# Patient Record
Sex: Female | Born: 1995 | Race: White | Marital: Married | Smoking: Never smoker
Health system: Southern US, Community
[De-identification: ages and names within clinical notes are randomized; demographics above are authoritative.]

---

## 2021-02-01 ENCOUNTER — Other Ambulatory Visit: Payer: Self-pay

## 2021-02-01 ENCOUNTER — Other Ambulatory Visit
Admission: RE | Admit: 2021-02-01 | Discharge: 2021-02-01 | Disposition: A | Discharge status: Home / Self Care | Payer: Self-pay | Attending: Nephrology | Admitting: Nephrology

## 2021-02-01 LAB — HEPATITIS B CORE ANTIBODY, TOTAL: Hep B Core Total Ab: NONREACTIVE

## 2021-02-01 LAB — HEPATITIS B SURFACE ANTIGEN: Hepatitis B Surface Ag: NONREACTIVE

## 2021-02-01 LAB — HEPATITIS C ANTIBODY: HCV Ab: NONREACTIVE

## 2021-02-02 LAB — HEPATITIS B SURFACE ANTIBODY, QUANTITATIVE: Hep B S AB Quant (Post): <3.1 m[IU]/mL — ABNORMAL LOW (ref >9.9)

## 2021-02-28 ENCOUNTER — Emergency Department
Admission: EM | Admit: 2021-02-28 | Discharge: 2021-02-28 | Disposition: A | Discharge status: Home / Self Care | Payer: BC Managed Care – PPO | Attending: Emergency Medicine | Admitting: Emergency Medicine

## 2021-02-28 ENCOUNTER — Emergency Department: Payer: BC Managed Care – PPO

## 2021-02-28 ENCOUNTER — Encounter: Payer: Self-pay | Admitting: *Deleted

## 2021-02-28 ENCOUNTER — Other Ambulatory Visit: Payer: Self-pay

## 2021-02-28 DIAGNOSIS — R5383 Other fatigue: Secondary | ICD-10-CM | POA: Diagnosis not present

## 2021-02-28 DIAGNOSIS — R000 Tachycardia, unspecified: Secondary | ICD-10-CM | POA: Diagnosis not present

## 2021-02-28 DIAGNOSIS — R519 Headache, unspecified: Secondary | ICD-10-CM | POA: Insufficient documentation

## 2021-02-28 DIAGNOSIS — R6883 Chills (without fever): Secondary | ICD-10-CM | POA: Diagnosis not present

## 2021-02-28 DIAGNOSIS — R42 Dizziness and giddiness: Secondary | ICD-10-CM | POA: Diagnosis present

## 2021-02-28 DIAGNOSIS — H538 Other visual disturbances: Secondary | ICD-10-CM | POA: Insufficient documentation

## 2021-02-28 DIAGNOSIS — R110 Nausea: Secondary | ICD-10-CM | POA: Insufficient documentation

## 2021-02-28 DIAGNOSIS — Z20822 Contact with and (suspected) exposure to covid-19: Secondary | ICD-10-CM | POA: Diagnosis not present

## 2021-02-28 LAB — BASIC METABOLIC PANEL
Anion gap: 10 (ref 5–15)
BUN: 10 mg/dL (ref 6–20)
CO2: 25 mmol/L (ref 22–32)
Calcium: 9.8 mg/dL (ref 8.9–10.3)
Chloride: 101 mmol/L (ref 98–111)
Creatinine, Ser: 0.64 mg/dL (ref 0.44–1.00)
GFR, Estimated: >60 mL/min (ref >60)
Glucose, Bld: 102 mg/dL — ABNORMAL HIGH (ref 70–99)
Potassium: 4 mmol/L (ref 3.5–5.1)
Sodium: 136 mmol/L (ref 135–145)

## 2021-02-28 LAB — URINALYSIS, COMPLETE (UACMP) WITH MICROSCOPIC
Bilirubin Urine: NEGATIVE
Glucose, UA: NEGATIVE mg/dL
Hgb urine dipstick: NEGATIVE
Ketones, ur: 5 mg/dL — AB
Leukocytes,Ua: NEGATIVE
Nitrite: NEGATIVE
Protein, ur: NEGATIVE mg/dL
Specific Gravity, Urine: 1.002 — ABNORMAL LOW (ref 1.005–1.030)
pH: 7 (ref 5.0–8.0)

## 2021-02-28 LAB — RESP PANEL BY RT-PCR (FLU A&B, COVID) ARPGX2
Influenza A by PCR: NEGATIVE
Influenza B by PCR: NEGATIVE
SARS Coronavirus 2 by RT PCR: NEGATIVE

## 2021-02-28 LAB — T4, FREE: Free T4: 0.88 ng/dL (ref 0.61–1.12)

## 2021-02-28 LAB — D-DIMER, QUANTITATIVE: D-Dimer, Quant: <0.27 ug/mL-FEU (ref 0.00–0.50)

## 2021-02-28 LAB — TROPONIN I (HIGH SENSITIVITY)
Troponin I (High Sensitivity): 3 ng/L (ref <18)
Troponin I (High Sensitivity): 3 ng/L (ref <18)

## 2021-02-28 LAB — TSH: TSH: 3.806 u[IU]/mL (ref 0.350–4.500)

## 2021-02-28 LAB — CBC
HCT: 38.7 % (ref 36.0–46.0)
Hemoglobin: 12.9 g/dL (ref 12.0–15.0)
MCH: 28.1 pg (ref 26.0–34.0)
MCHC: 33.3 g/dL (ref 30.0–36.0)
MCV: 84.3 fL (ref 80.0–100.0)
Platelets: 484 10*3/uL — ABNORMAL HIGH (ref 150–400)
RBC: 4.59 MIL/uL (ref 3.87–5.11)
RDW: 12.8 % (ref 11.5–15.5)
WBC: 18.6 10*3/uL — ABNORMAL HIGH (ref 4.0–10.5)
nRBC: 0 % (ref 0.0–0.2)

## 2021-02-28 LAB — POC URINE PREG, ED: Preg Test, Ur: NEGATIVE

## 2021-02-28 MED ORDER — SODIUM CHLORIDE 0.9 % IV BOLUS
1000.0000 mL | Freq: Once | INTRAVENOUS | Status: AC
  Administered 2021-02-28: 1000 mL via INTRAVENOUS

## 2021-02-28 MED ORDER — ONDANSETRON HCL 4 MG/2ML IJ SOLN
4.0000 mg | Freq: Once | INTRAMUSCULAR | Status: AC
  Administered 2021-02-28: 4 mg via INTRAVENOUS
  Filled 2021-02-28: qty 2

## 2021-02-28 NOTE — Discharge Instructions (Signed)
Your lab tests and CT scan were all normal today.  Please continue to drink lots of fluids to stay well-hydrated, allow yourself extra rest, and follow-up with primary care.

## 2021-02-28 NOTE — ED Provider Notes (Signed)
[facility] Emergency Department Provider Note  ____________________________________________  Time seen: Approximately 11:05 PM  I have reviewed the triage vital signs and the nursing notes.   HISTORY  Chief Complaint Dizziness    HPI Eileen Parker is a 25 y.o. female with no significant past medical history who comes ED complaining of  dizziness with nausea, occipital headache, and blurry vision that started at work today about 11:00 AM.  Constant, gradually improved after onset.  Currently feels much better.  She is steady on her feet.  Also has chills and fatigue.  Has been eating and drinking normally.  No chest pain shortness of breath belly pain vomiting.  No known sick contacts.     Past medical history significant for anxiety.   There are no problems to display for this patient.       Prior to Admission medications   Not on File  None   Allergies Patient has no known allergies.   No family history on file.  Social History Social History   Tobacco Use  . Smoking status: Never Smoker  . Smokeless tobacco: Never Used  Substance Use Topics  . Alcohol use: Not Currently  . Drug use: Not Currently    Review of Systems  Constitutional:   No fever positive chills.  ENT:   No sore throat. No rhinorrhea. Cardiovascular:   No chest pain or syncope. Respiratory:   No dyspnea or cough. Gastrointestinal:   Negative for abdominal pain, vomiting and diarrhea.  Musculoskeletal:   Negative for focal pain or swelling All other systems reviewed and are negative except as documented above in ROS and HPI.  ____________________________________________   PHYSICAL EXAM:  VITAL SIGNS: ED Triage Vitals [02/28/21 1912]  Enc Vitals Group     BP (!) 147/94     Pulse Rate (!) 125     Resp 20     Temp 99.2 F (37.3 C)     Temp Source Oral     SpO2 99 %     Weight 213 lb (96.6 kg)     Height 5\' 6"  (1.676 m)     Head Circumference       Peak Flow      Pain Score 5     Pain Loc      Pain Edu?      Excl. in GC?     Vital signs reviewed, nursing assessments reviewed.   Constitutional:   Alert and oriented. Non-toxic appearance. Eyes:   Conjunctivae are normal. EOMI. PERRL.  No nystagmus ENT      Head:   Normocephalic and atraumatic.      Nose:   Wearing a mask.      Mouth/Throat:   Wearing a mask.      Neck:   No meningismus. Full ROM. Hematological/Lymphatic/Immunilogical:   No cervical lymphadenopathy. Cardiovascular:   Tachycardia heart rate 115. Symmetric bilateral radial and DP pulses.  No murmurs. Cap refill less than 2 seconds. Respiratory:   Normal respiratory effort without tachypnea/retractions. Breath sounds are clear and equal bilaterally. No wheezes/rales/rhonchi. Gastrointestinal:   Soft and nontender. Non distended. There is no CVA tenderness.  No rebound, rigidity, or guarding. Genitourinary:   deferred Musculoskeletal:   Normal range of motion in all extremities. No joint effusions.  No lower extremity tenderness.  No edema. Neurologic:   Normal speech and language.  Motor grossly intact. No acute focal neurologic deficits are appreciated.  Skin:    Skin is warm, dry and  intact. No rash noted.  No petechiae, purpura, or bullae.  ____________________________________________    LABS (pertinent positives/negatives) (all labs ordered are listed, but only abnormal results are displayed) Labs Reviewed  BASIC METABOLIC PANEL - Abnormal; Notable for the following components:      Result Value   Glucose, Bld 102 (*)    All other components within normal limits  CBC - Abnormal; Notable for the following components:   WBC 18.6 (*)    Platelets 484 (*)    All other components within normal limits  URINALYSIS, COMPLETE (UACMP) WITH MICROSCOPIC - Abnormal; Notable for the following components:   Color, Urine COLORLESS (*)    APPearance CLEAR (*)    Specific Gravity, Urine 1.002 (*)    Ketones, ur 5 (*)     Bacteria, UA FEW (*)    All other components within normal limits  POC URINE PREG, ED - Normal  RESP PANEL BY RT-PCR (FLU A&B, COVID) ARPGX2  D-DIMER, QUANTITATIVE  TSH  T4, FREE  TROPONIN I (HIGH SENSITIVITY)  TROPONIN I (HIGH SENSITIVITY)   ____________________________________________   EKG  Interpreted by me Sinus tachycardia rate 112.  Normal axis and intervals.  Normal QRS and ST segments.  There is a T wave inversion in lead III, S1Q3T3 pattern  ____________________________________________    RADIOLOGY  DG Chest 2 View  Result Date: 02/28/2021 CLINICAL DATA:  Dizziness EXAM: CHEST - 2 VIEW COMPARISON:  None. FINDINGS: The heart size and mediastinal contours are within normal limits. Both lungs are clear. The visualized skeletal structures are unremarkable. IMPRESSION: No active cardiopulmonary disease. Electronically Signed   By: Jasmine Pang M.D.   On: 02/28/2021 19:43   CT Head Wo Contrast  Result Date: 02/28/2021 CLINICAL DATA:  Dizziness. EXAM: CT HEAD WITHOUT CONTRAST TECHNIQUE: Contiguous axial images were obtained from the base of the skull through the vertex without intravenous contrast. COMPARISON:  None. FINDINGS: Brain: No evidence of acute infarction, hemorrhage, hydrocephalus, extra-axial collection or mass lesion/mass effect. Vascular: No hyperdense vessel or unexpected calcification. Skull: Normal. Negative for fracture or focal lesion. Sinuses/Orbits: No acute finding. Other: None. IMPRESSION: No acute intracranial abnormality. Electronically Signed   By: Aram Candela M.D.   On: 02/28/2021 20:59    ____________________________________________   PROCEDURES Procedures  ____________________________________________  DIFFERENTIAL DIAGNOSIS   Dehydration, anxiety, pulmonary embolism, hyperthyroidism, viral illness  CLINICAL IMPRESSION / ASSESSMENT AND PLAN / ED COURSE  Medications ordered in the ED: Medications  sodium chloride 0.9 % bolus 1,000  mL (0 mLs Intravenous Stopped 02/28/21 2205)  ondansetron (ZOFRAN) injection 4 mg (4 mg Intravenous Given 02/28/21 2039)    Pertinent labs & imaging results that were available during my care of the patient were reviewed by me and considered in my medical decision making (see chart for details).  Saranya Harlin was evaluated in Emergency Department on 02/28/2021 for the symptoms described in the history of present illness. She was evaluated in the context of the global COVID-19 pandemic, which necessitated consideration that the patient might be at risk for infection with the SARS-CoV-2 virus that causes COVID-19. Institutional protocols and algorithms that pertain to the evaluation of patients at risk for COVID-19 are in a state of rapid change based on information released by regulatory bodies including the CDC and federal and state organizations. These policies and algorithms were followed during the patient's care in the ED.   Patient presents with vague constitutional symptoms, episode of blurry vision and dizziness earlier today.  CT head  negative for intracranial hemorrhage or tumor.  Labs all unremarkable except for mild leukocytosis which is nonspecific.  No evidence of infection.  Heart rate normalized after IV fluids.  Stable for discharge home.  Clinical Course as of 02/28/21 2310  Mon Feb 28, 2021  2309 Chest x-ray viewed and interpreted by me, unremarkable.  COVID-negative.  D-dimer normal [PS]    Clinical Course User Index [PS] Sharman Cheek, MD     ____________________________________________   FINAL CLINICAL IMPRESSION(S) / ED DIAGNOSES    Final diagnoses:  Dizziness     ED Discharge Orders    None      Portions of this note were generated with dragon dictation software. Dictation errors may occur despite best attempts at proofreading.   Sharman Cheek, MD 02/28/21 [phone]

## 2021-02-28 NOTE — ED Triage Notes (Signed)
Pt had dizziness episode at work today.  Pt reports nausea.  Pt also has a headache.  No chest pain.  Hx anxiety.  Pt alert  Speech clear.

## 2021-05-18 ENCOUNTER — Encounter: Payer: Self-pay | Admitting: Emergency Medicine

## 2021-05-18 ENCOUNTER — Emergency Department: Payer: BC Managed Care – PPO

## 2021-05-18 ENCOUNTER — Other Ambulatory Visit: Payer: Self-pay

## 2021-05-18 ENCOUNTER — Emergency Department
Admission: EM | Admit: 2021-05-18 | Discharge: 2021-05-18 | Disposition: A | Discharge status: Home / Self Care | Payer: BC Managed Care – PPO | Attending: Emergency Medicine | Admitting: Emergency Medicine

## 2021-05-18 DIAGNOSIS — U071 COVID-19: Secondary | ICD-10-CM | POA: Diagnosis not present

## 2021-05-18 DIAGNOSIS — R509 Fever, unspecified: Secondary | ICD-10-CM

## 2021-05-18 DIAGNOSIS — B349 Viral infection, unspecified: Secondary | ICD-10-CM | POA: Insufficient documentation

## 2021-05-18 DIAGNOSIS — R110 Nausea: Secondary | ICD-10-CM | POA: Insufficient documentation

## 2021-05-18 DIAGNOSIS — Z20822 Contact with and (suspected) exposure to covid-19: Secondary | ICD-10-CM

## 2021-05-18 LAB — SARS CORONAVIRUS 2 (TAT 6-24 HRS): SARS Coronavirus 2: POSITIVE — AB

## 2021-05-18 LAB — GROUP A STREP BY PCR: Group A Strep by PCR: NOT DETECTED

## 2021-05-18 MED ORDER — ACETAMINOPHEN 500 MG PO TABS
1000.0000 mg | ORAL_TABLET | Freq: Once | ORAL | Status: AC
  Administered 2021-05-18: 1000 mg via ORAL
  Filled 2021-05-18: qty 2

## 2021-05-18 MED ORDER — ONDANSETRON HCL 4 MG PO TABS: 4.0000 mg | ORAL_TABLET | Freq: Three times a day (TID) | ORAL | 0 refills | Status: AC | PRN

## 2021-05-18 NOTE — ED Provider Notes (Signed)
[facility] Emergency Department Provider Note  ____________________________________________   Event Date/Time   First MD Initiated Contact with Patient 05/18/21 [phone]     (approximate)  I have reviewed the triage vital signs and the nursing notes.   HISTORY  Chief Complaint Fever   HPI Eileen Parker is a 25 y.o. female without significant past medical history who presents for assessment of 1 day of generalized body aches associate with sore throat fever and little nausea.  She denies any specific headache, chest pain, shortness of breath, cough, abdominal pain, diarrhea, urinary symptoms, rash or focal pain in the extremities.  No recent falls or injuries.  She denies tobacco abuse or illicit drug use.  No other acute concerns at this time.         History reviewed. No pertinent past medical history.  There are no problems to display for this patient.   History reviewed. No pertinent surgical history.  Prior to Admission medications   Medication Sig Start Date End Date Taking? Authorizing Provider  ondansetron (ZOFRAN) 4 MG tablet Take 1 tablet (4 mg total) by mouth every 8 (eight) hours as needed for up to 10 doses for nausea or vomiting. 05/18/21  Yes Gilles Chiquito, MD    Allergies Patient has no known allergies.  No family history on file.  Social History Social History   Tobacco Use   Smoking status: Never   Smokeless tobacco: Never  Vaping Use   Vaping Use: Never used  Substance Use Topics   Alcohol use: Not Currently   Drug use: Not Currently    Review of Systems  Review of Systems  Constitutional:  Positive for chills and fever.  HENT:  Positive for sore throat.   Eyes:  Negative for pain.  Respiratory:  Negative for cough and stridor.   Cardiovascular:  Negative for chest pain.  Gastrointestinal:  Negative for vomiting.  Genitourinary:  Negative for dysuria.  Musculoskeletal:  Positive for myalgias.  Skin:  Negative  for rash.  Neurological:  Negative for seizures, loss of consciousness and headaches.  Psychiatric/Behavioral:  Negative for suicidal ideas.   All other systems reviewed and are negative.    ____________________________________________   PHYSICAL EXAM:  VITAL SIGNS: ED Triage Vitals  Enc Vitals Group     BP 05/18/21 0144 137/79     Pulse Rate 05/18/21 0144 (!) 120     Resp 05/18/21 0144 18     Temp 05/18/21 0144 (!) 103.2 F (39.6 C)     Temp Source 05/18/21 0144 Oral     SpO2 05/18/21 0144 98 %     Weight 05/18/21 0139 220 lb (99.8 kg)     Height 05/18/21 0139 5\' 6"  (1.676 m)     Head Circumference --      Peak Flow --      Pain Score 05/18/21 0139 7     Pain Loc --      Pain Edu? --      Excl. in GC? --    Vitals:   05/18/21 0144 05/18/21 0308  BP: 137/79 113/70  Pulse: (!) 120 98  Resp: 18 18  Temp: (!) 103.2 F (39.6 C) 99.5 F (37.5 C)  SpO2: 98% 100%   Physical Exam Vitals and nursing note reviewed.  Constitutional:      General: She is not in acute distress.    Appearance: She is well-developed.  HENT:     Head: Normocephalic and atraumatic.  Right Ear: External ear normal.     Left Ear: External ear normal.     Nose: Nose normal.     Mouth/Throat:     Mouth: Mucous membranes are moist.     Pharynx: Posterior oropharyngeal erythema present. No oropharyngeal exudate.  Eyes:     Conjunctiva/sclera: Conjunctivae normal.  Cardiovascular:     Rate and Rhythm: Normal rate and regular rhythm.     Pulses: Normal pulses.     Heart sounds: No murmur heard. Pulmonary:     Effort: Pulmonary effort is normal. No respiratory distress.     Breath sounds: Normal breath sounds.  Abdominal:     Palpations: Abdomen is soft.     Tenderness: There is no abdominal tenderness.  Musculoskeletal:     Cervical back: Neck supple. No rigidity.  Skin:    General: Skin is warm and dry.     Capillary Refill: Capillary refill takes less than 2 seconds.  Neurological:      Mental Status: She is alert and oriented to person, place, and time.  Psychiatric:        Mood and Affect: Mood normal.     ____________________________________________   LABS (all labs ordered are listed, but only abnormal results are displayed)  Labs Reviewed  GROUP A STREP BY PCR  SARS CORONAVIRUS 2 (TAT 6-24 HRS)  URINALYSIS, ROUTINE W REFLEX MICROSCOPIC  POC URINE PREG, ED   ____________________________________________  EKG   ____________________________________________  RADIOLOGY  ED MD interpretation: Chest x-ray has no effusion, edema, pneumothorax or any other clear acute intrathoracic process.  Official radiology report(s): DG Chest 2 View  Result Date: 05/18/2021 CLINICAL DATA:  Fever, sore throat and body aches. EXAM: CHEST - 2 VIEW COMPARISON:  Feb 28, 2021 FINDINGS: The heart size and mediastinal contours are within normal limits. Both lungs are clear. The visualized skeletal structures are unremarkable. IMPRESSION: No active cardiopulmonary disease. Electronically Signed   By: Aram Candela M.D.   On: 05/18/2021 04:22    ____________________________________________   PROCEDURES  Procedure(s) performed (including Critical Care):  Procedures   ____________________________________________   INITIAL IMPRESSION / ASSESSMENT AND PLAN / ED COURSE        Patient presents with a history of 1 day of generalized body aches associate with sore throat and some nausea.  On arrival she is febrile at 103.2 and tachycardic to 120 with otherwise stable vital signs on room air.  She did receive some Tylenol in triage and on my assessment with a temperature of 99.5 and heart rate of 98.  On exam she has some oropharyngeal erythema but no exudates or tonsillar enlargement uvular deviation or other evidence of deep space infection of the head or neck.  Lungs are clear bilaterally abdomen is soft nontender throughout.  She states she feels much better after Tylenol.   I suspect likely acute viral infection with COVID influenza high on the differential.  Chest x-ray and exam is not consistent with bacterial pneumonia.  Low suspicion at this time for bacterial sepsis, appendicitis, cystitis or other immediate life-threatening process.  Patient is not meningitic.  Given she states she is feeling better and had effervescence of her fever and resolution of her tachycardia with Tylenol with otherwise reassuring exam I think she is stable for discharge with close outpatient follow-up.  Advised her to isolate pending her COVID results return immediately if she experiences worsening of her symptoms.  Rx written for Zofran.      ____________________________________________  FINAL CLINICAL IMPRESSION(S) / ED DIAGNOSES  Final diagnoses:  Viral illness  Person under investigation for COVID-19    Medications  acetaminophen (TYLENOL) tablet 1,000 mg (1,000 mg Oral Given 05/18/21 0150)     ED Discharge Orders          Ordered    ondansetron (ZOFRAN) 4 MG tablet  Every 8 hours PRN        05/18/21 0444             Note:  This document was prepared using Dragon voice recognition software and may include unintentional dictation errors.    Gilles Chiquito, MD 05/18/21 [phone]

## 2021-05-18 NOTE — ED Triage Notes (Signed)
Patient ambulatory to triage with steady gait, without difficulty or distress noted; pt reports fever, sore throat and body aches since this am

## 2022-03-08 IMAGING — CR DG CHEST 2V
2 series · 2 of 2 positions shown · non-contrast
Comparison: None.

CLINICAL DATA: Dizziness

EXAM:
CHEST - 2 VIEW

[chest pa]
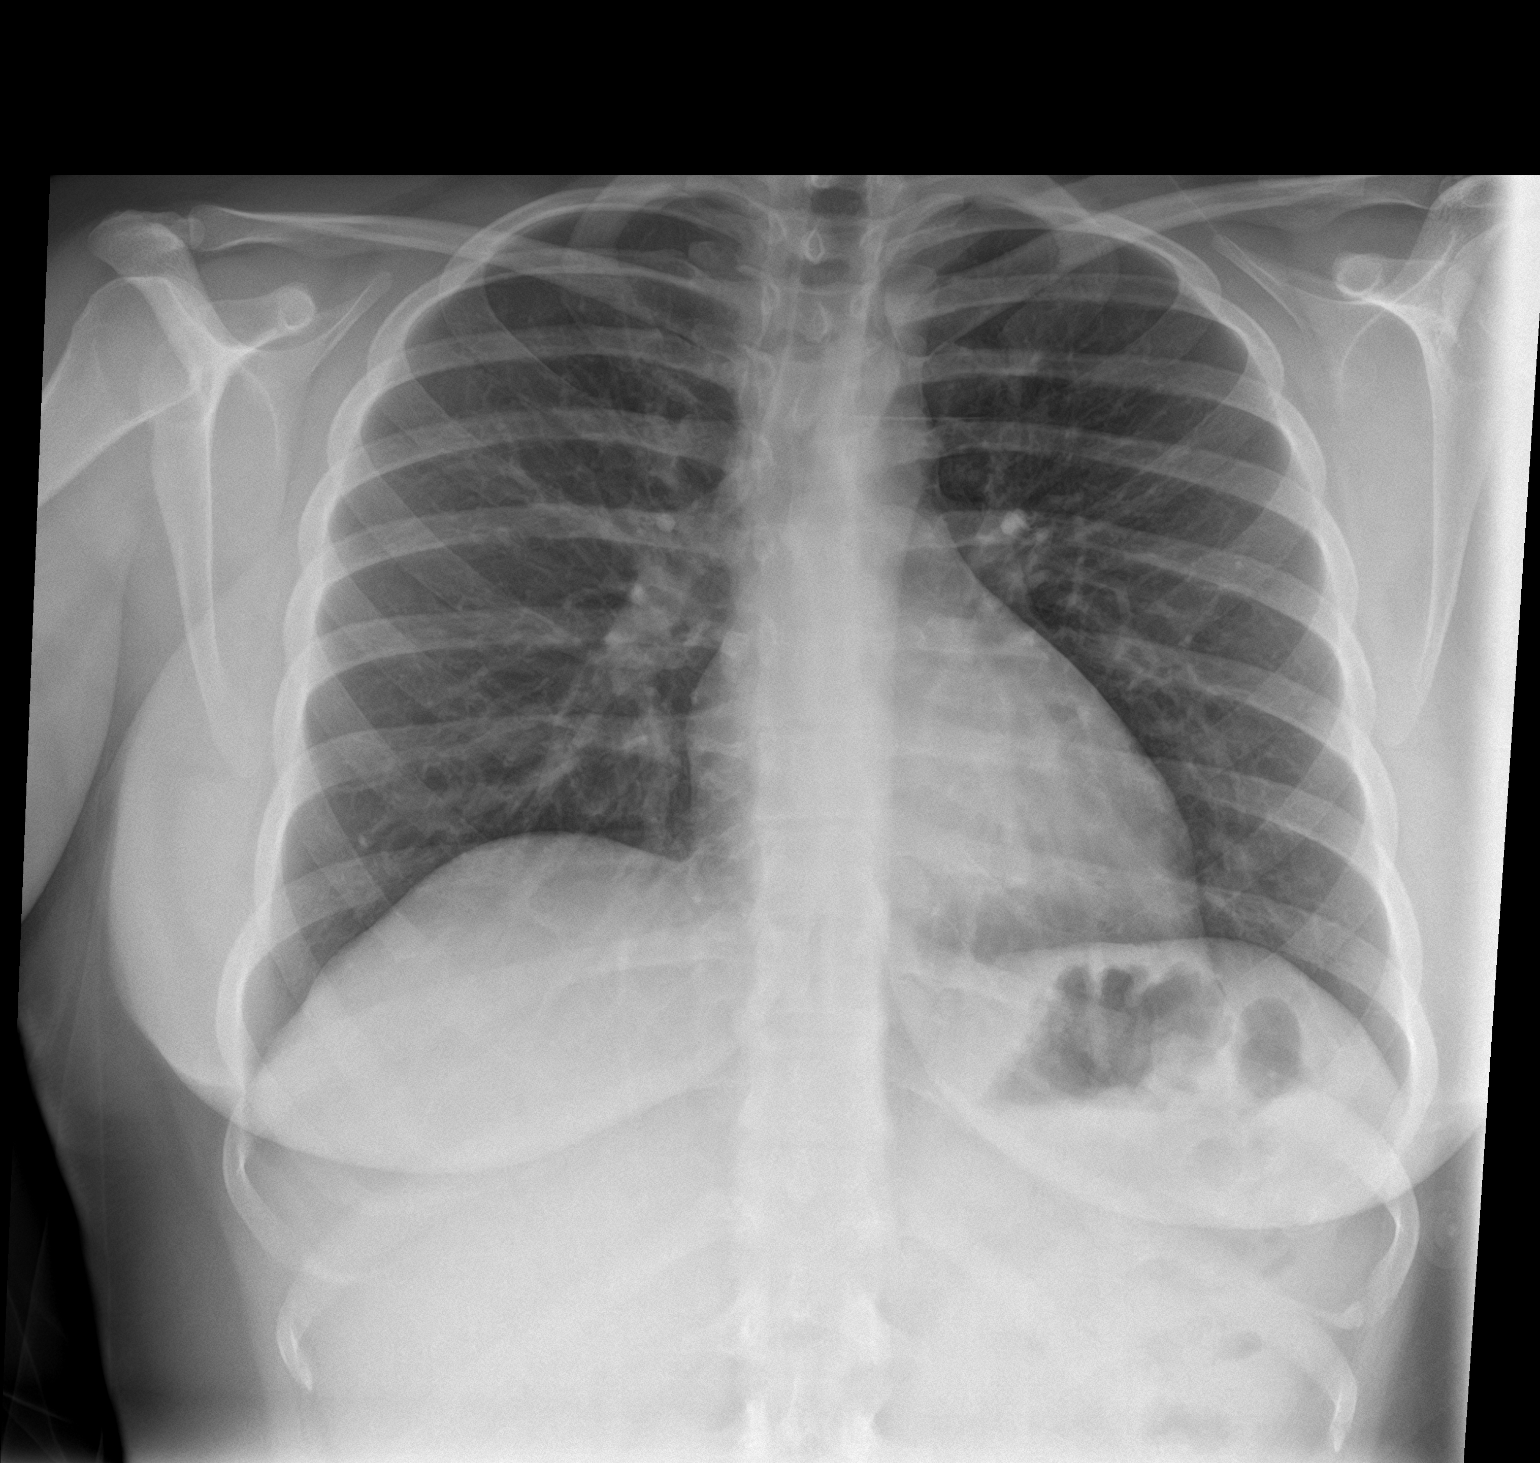

[chest lat]
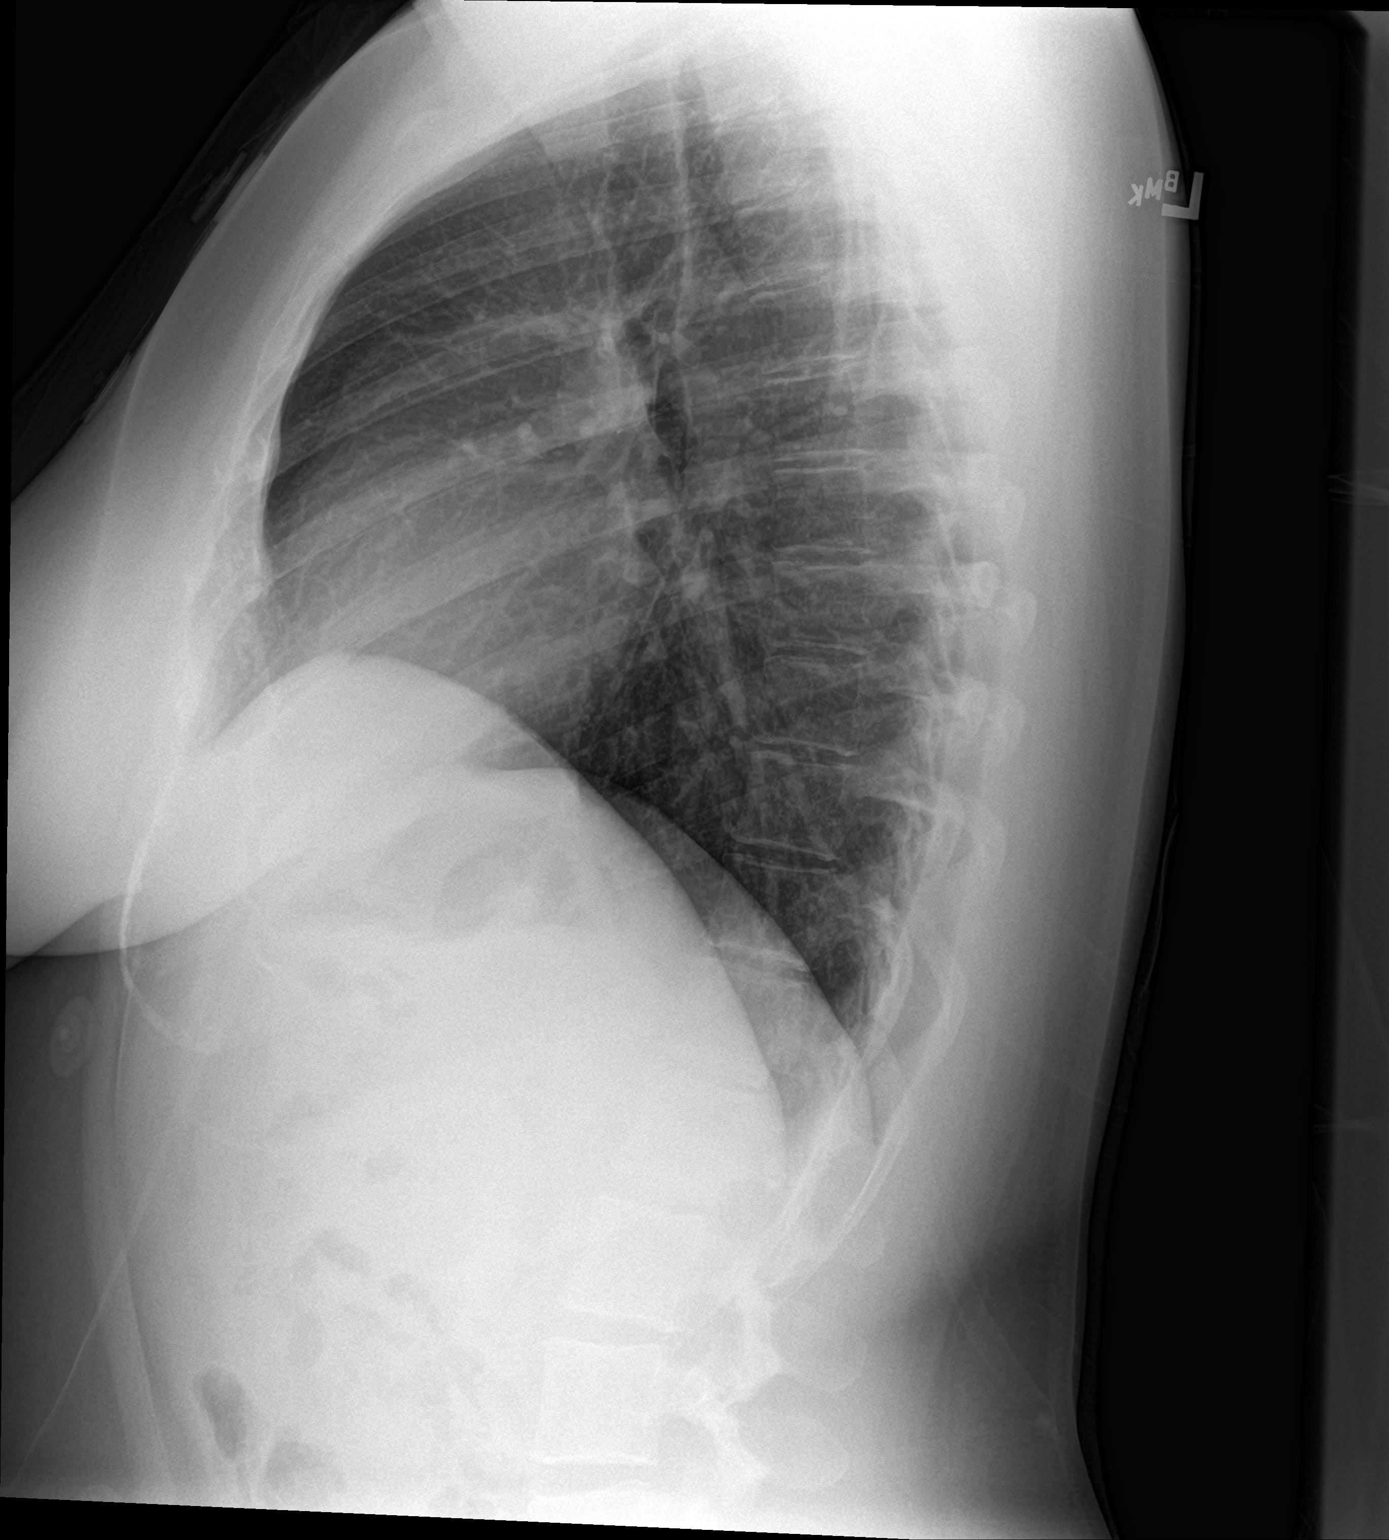

[2 of 2 positions shown; findings below may reference images not displayed]

FINDINGS: The heart size and mediastinal contours are within normal limits.
Both lungs are clear. The visualized skeletal structures are
unremarkable.
IMPRESSION: No active cardiopulmonary disease.

## 2022-03-08 IMAGING — CT CT HEAD W/O CM
3 series · 16 of 47 positions shown, 19 images · non-contrast
Comparison: None.

CLINICAL DATA: Dizziness.

EXAM:
CT HEAD WITHOUT CONTRAST
TECHNIQUE: Contiguous axial images were obtained from the base of the skull
through the vertex without intravenous contrast.

[Series 2: head wo · axial · 0.41mm/px · z∈[-106,+19]mm · 10 of 30 slices shown, 13 images]
[im 3/30  brain]
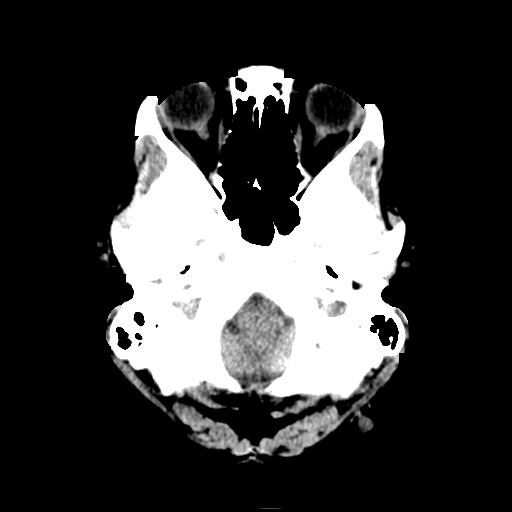
[im 3/30  bone]
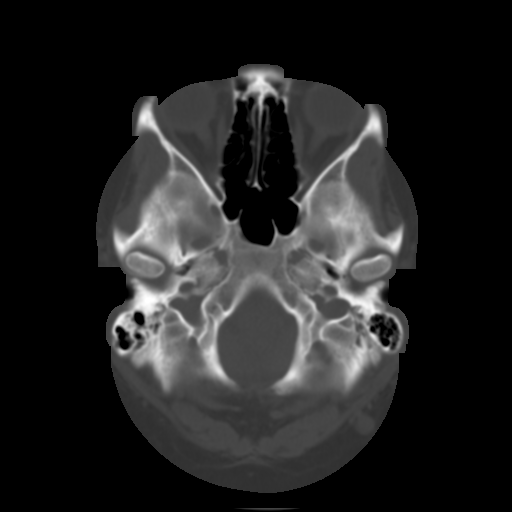
[im 6/30  brain]
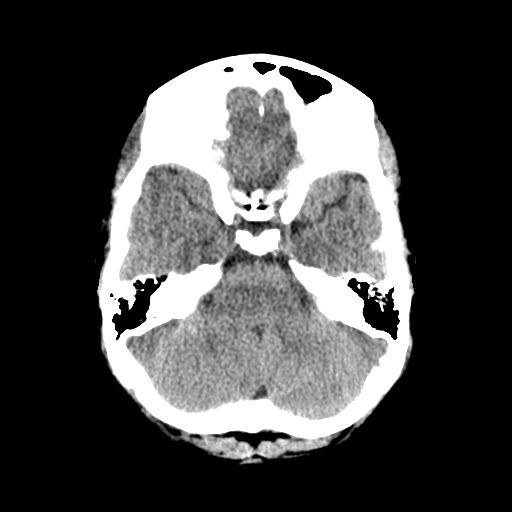
[im 9/30  brain]
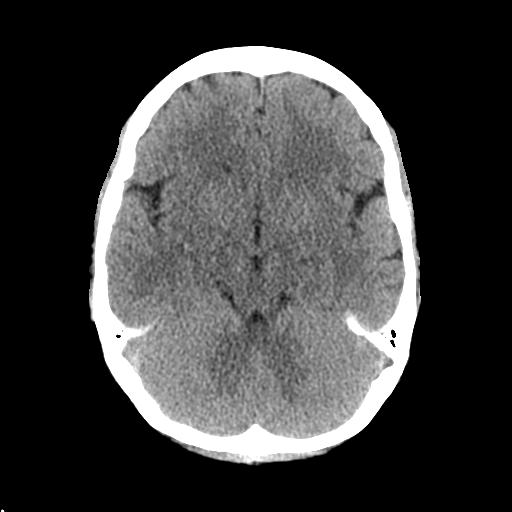
[im 11/30  brain]
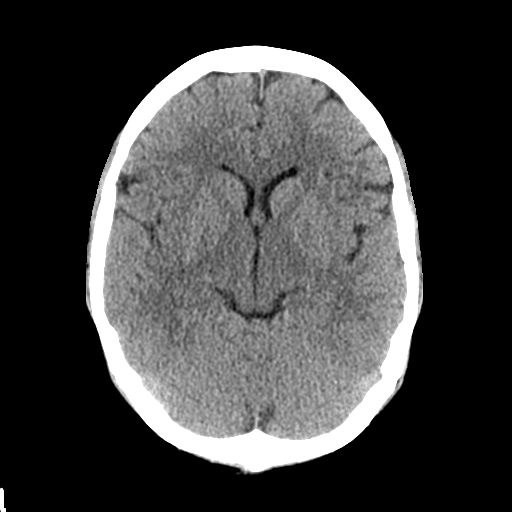
[im 14/30  brain]
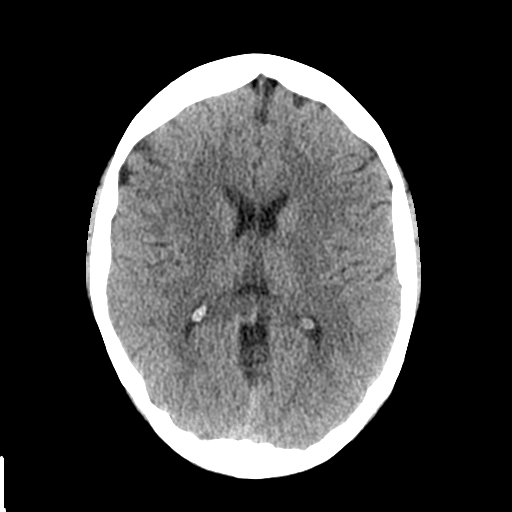
[im 14/30  bone]
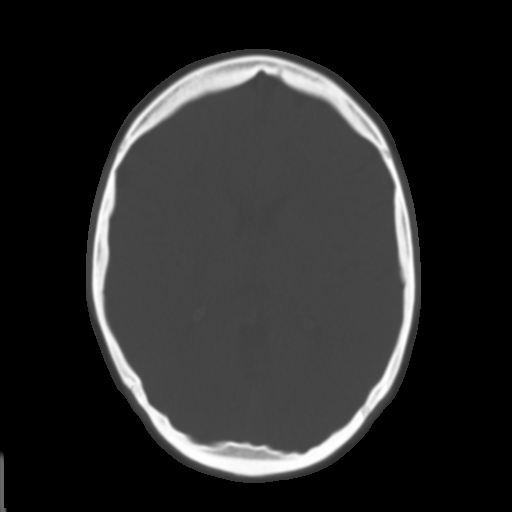
[im 17/30  brain]
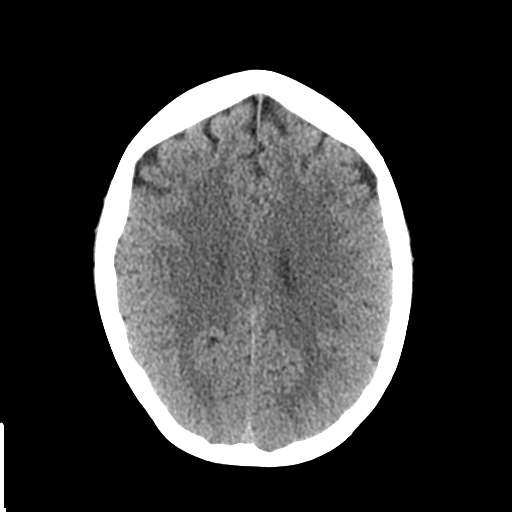
[im 20/30  brain]
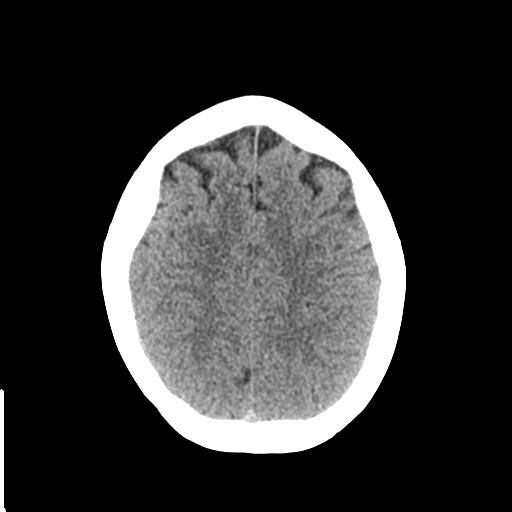
[im 23/30  brain]
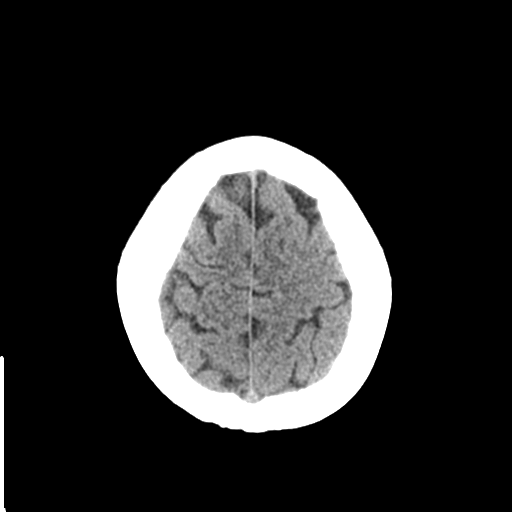
[im 25/30  brain]
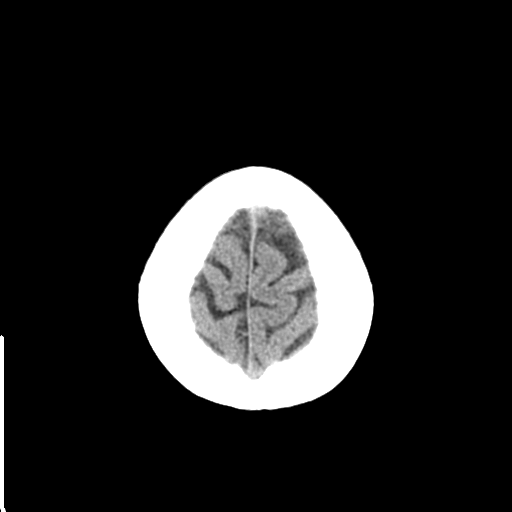
[im 25/30  bone]
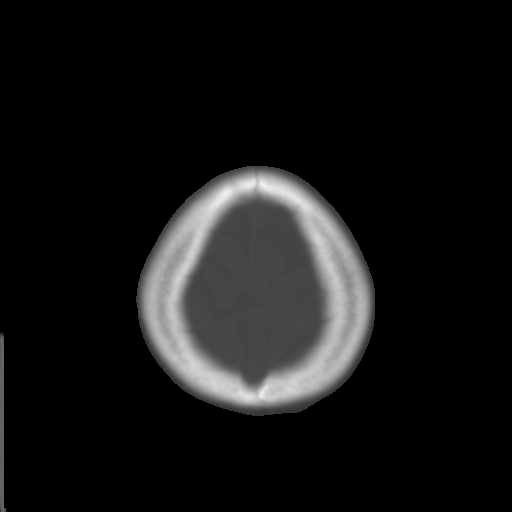
[im 28/30  brain]
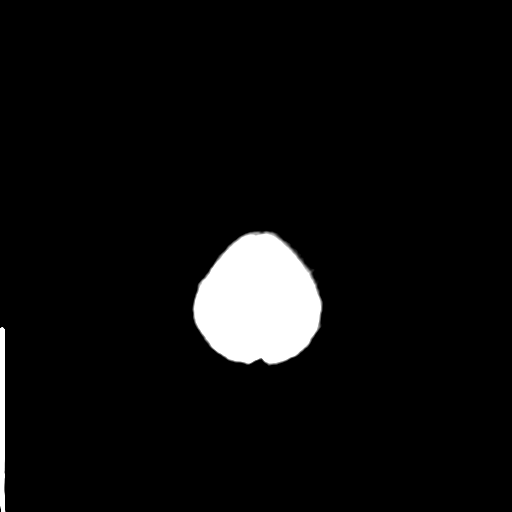

[Series 4: coronal soft tissue · coronal · 0.30mm/px · 3 of 58 slices shown]
[im 20/58  brain]
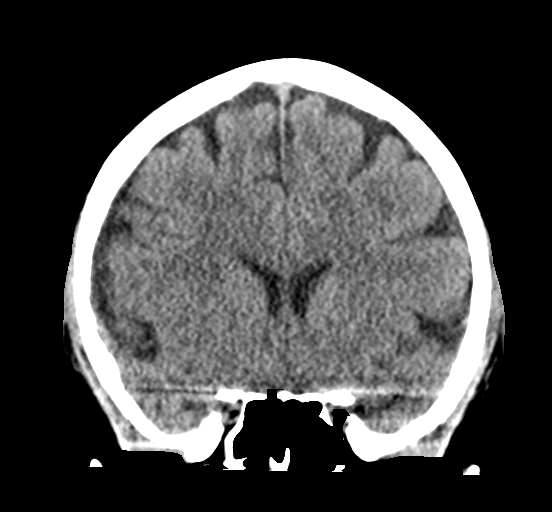
[im 26/58  brain]
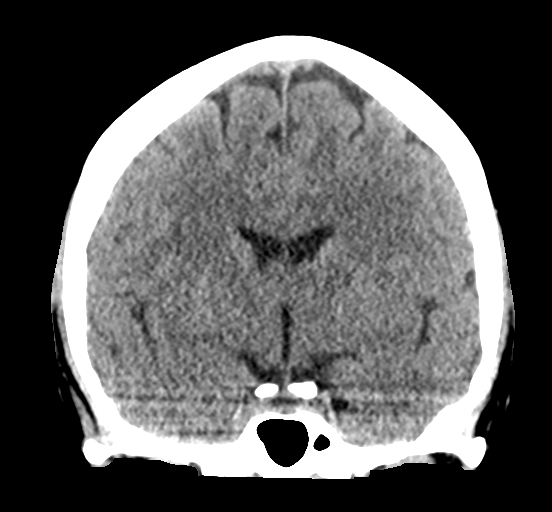
[im 32/58  brain]
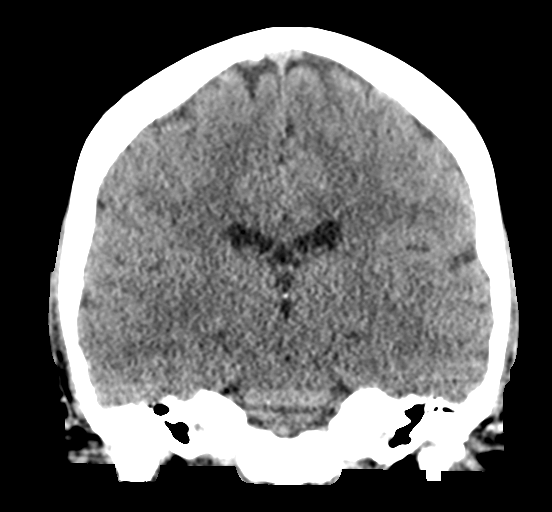

[Series 5: sagittal soft tissue · sagittal · 0.31mm/px · 3 of 50 slices shown]
[im 17/50  brain]
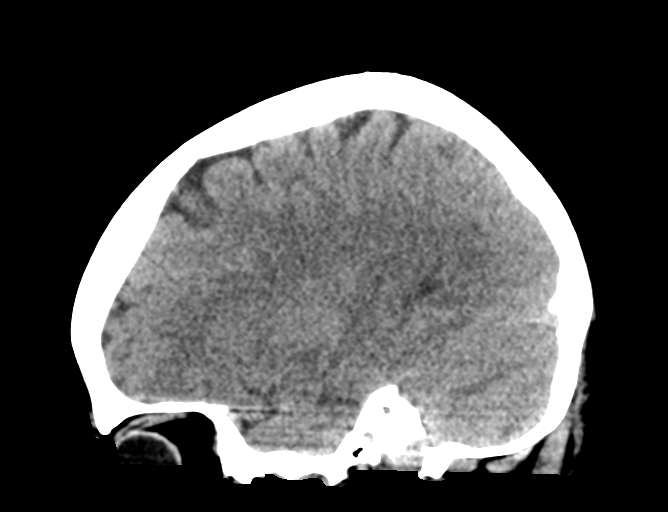
[im 25/50  brain]
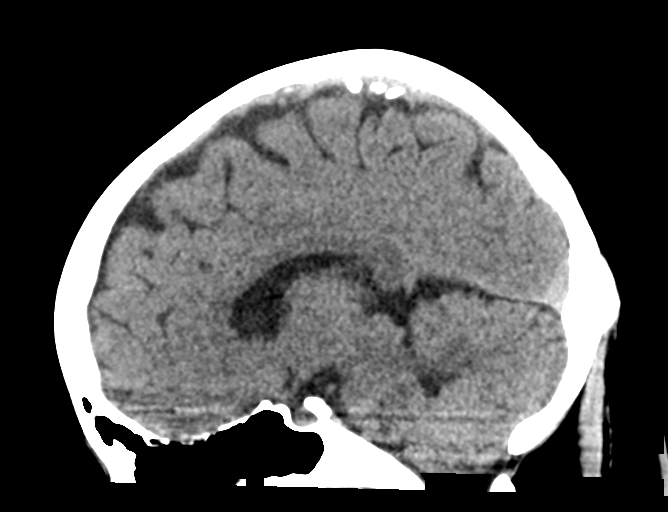
[im 33/50  brain]
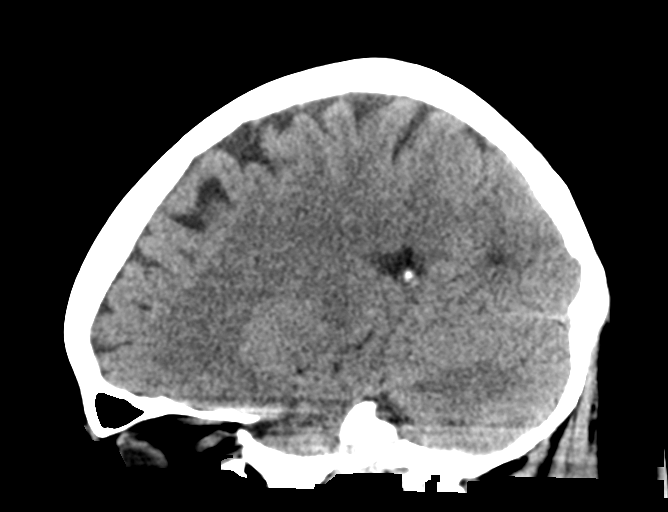

[16 of 47 positions shown; findings below may reference images not displayed]

FINDINGS: Brain: No evidence of acute infarction, hemorrhage, hydrocephalus,
extra-axial collection or mass lesion/mass effect.

Vascular: No hyperdense vessel or unexpected calcification.

Skull: Normal. Negative for fracture or focal lesion.

Sinuses/Orbits: No acute finding.

Other: None.
IMPRESSION: No acute intracranial abnormality.

## 2022-05-26 IMAGING — CR DG CHEST 2V
2 series · 2 of 2 positions shown · non-contrast
Comparison: February 28, 2021

CLINICAL DATA: Fever, sore throat and body aches.

EXAM:
CHEST - 2 VIEW

[chest pa]
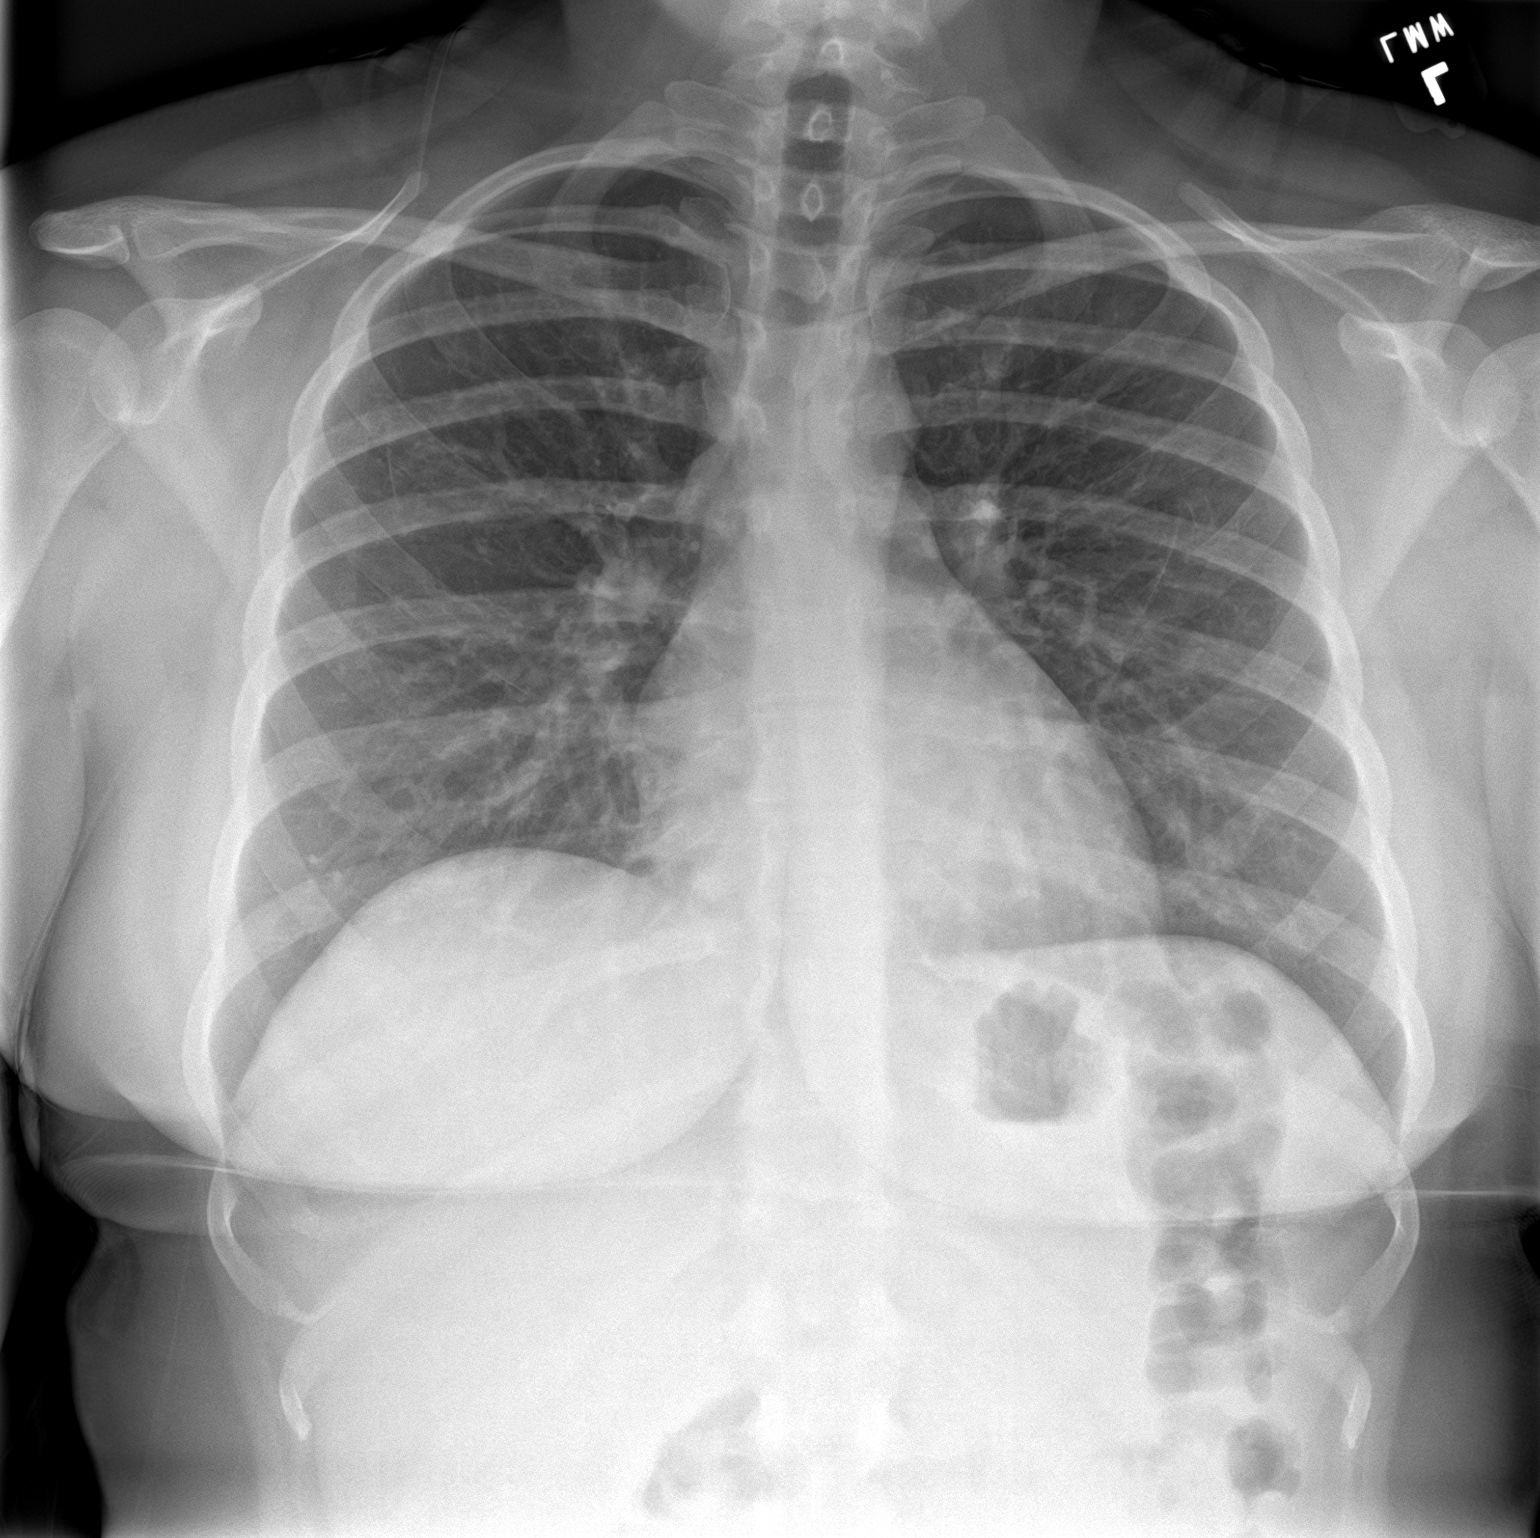

[chest lat]
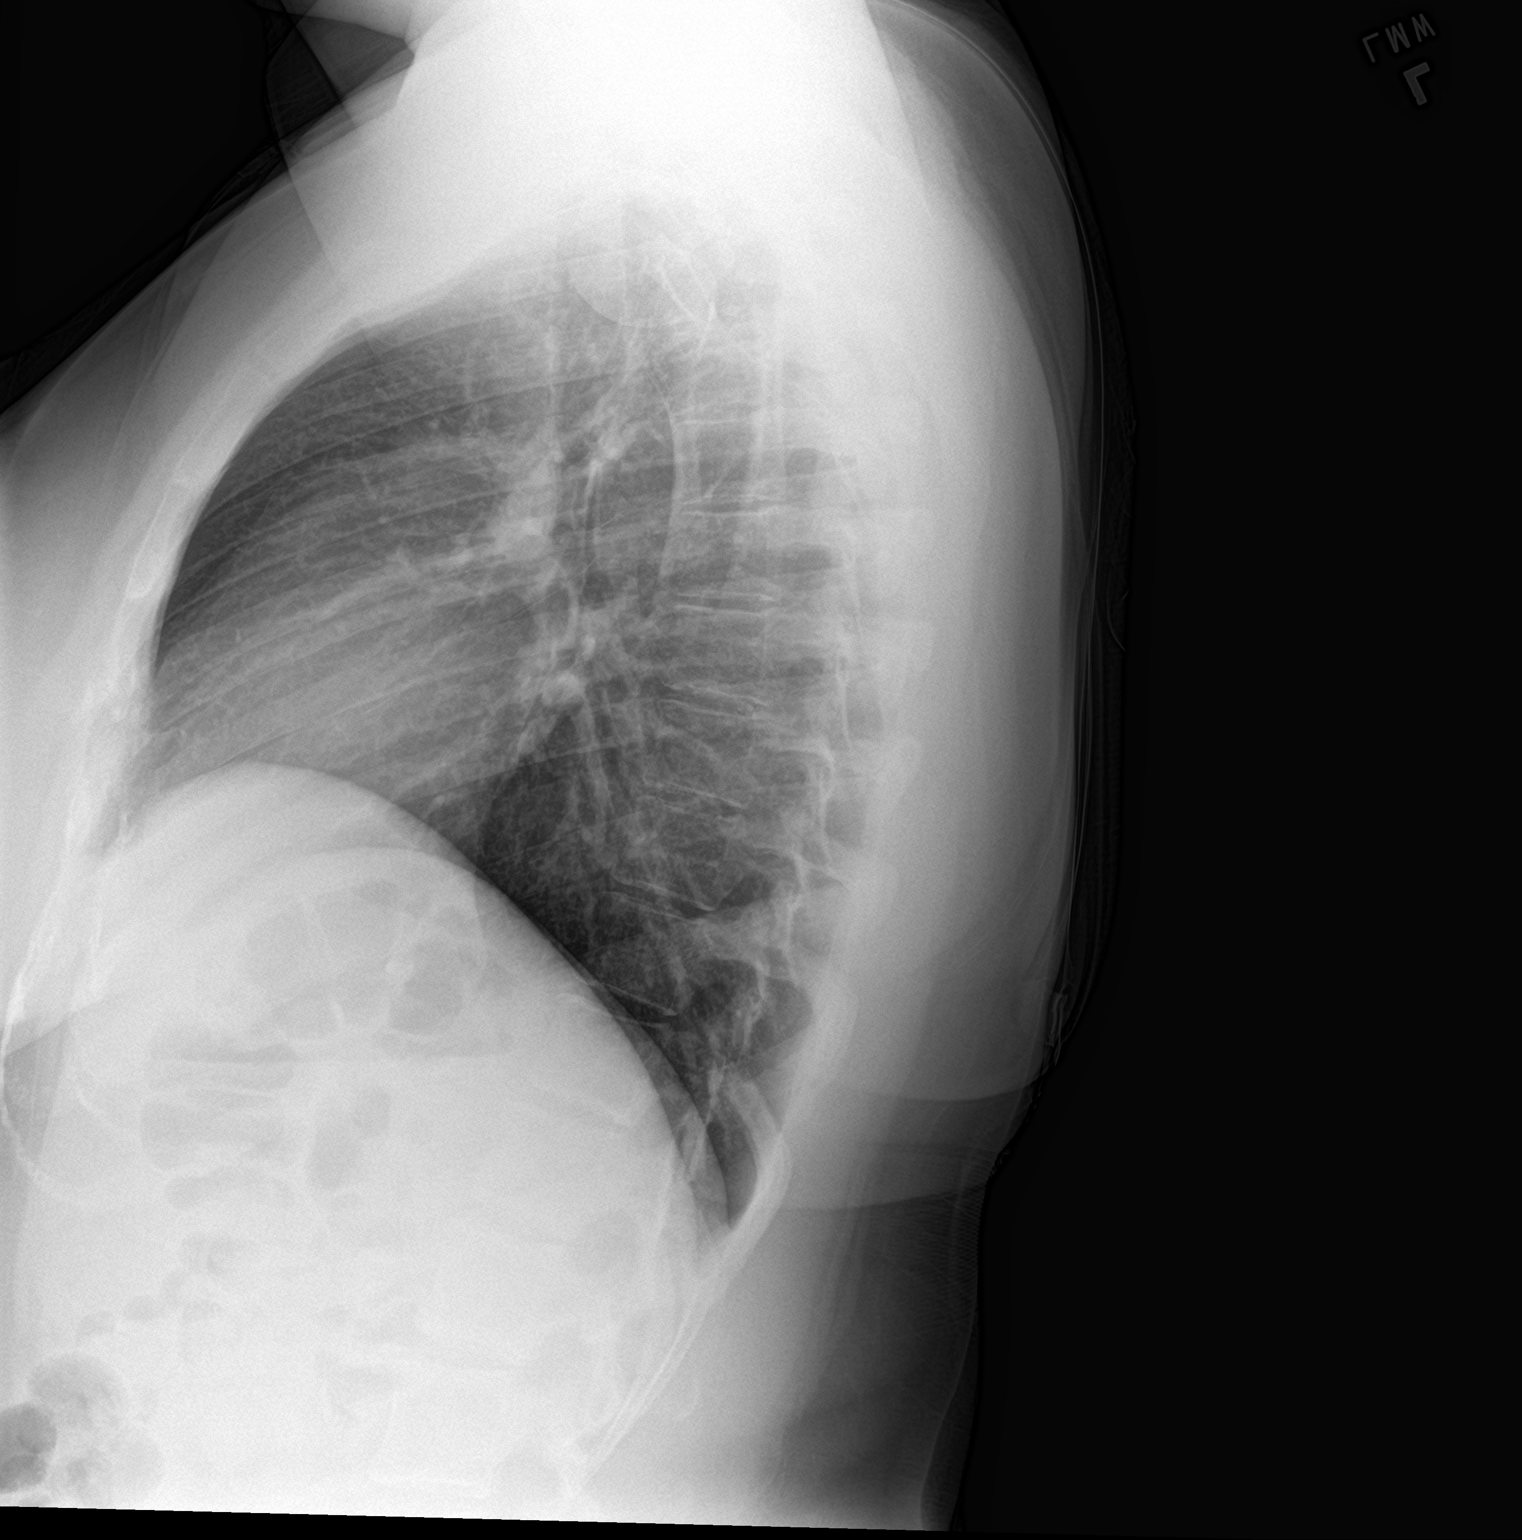

[2 of 2 positions shown; findings below may reference images not displayed]

FINDINGS: The heart size and mediastinal contours are within normal limits.
Both lungs are clear. The visualized skeletal structures are
unremarkable.
IMPRESSION: No active cardiopulmonary disease.
